# Patient Record
Sex: Male | Born: 1970 | Race: White | Hispanic: No | Marital: Married | Smoking: Never smoker
Health system: Southern US, Community
[De-identification: ages and names within clinical notes are randomized; demographics above are authoritative.]

## PROBLEM LIST (undated history)

## (undated) HISTORY — PX: VASECTOMY: SHX75

---

## 2015-04-24 ENCOUNTER — Emergency Department (HOSPITAL_COMMUNITY)
Admission: EM | Admit: 2015-04-24 | Discharge: 2015-04-25 | Disposition: A | Discharge status: Home / Self Care | Payer: BLUE CROSS/BLUE SHIELD | Attending: Emergency Medicine | Admitting: Emergency Medicine

## 2015-04-24 ENCOUNTER — Emergency Department (HOSPITAL_COMMUNITY): Payer: BLUE CROSS/BLUE SHIELD

## 2015-04-24 ENCOUNTER — Encounter (HOSPITAL_COMMUNITY): Payer: Self-pay | Admitting: Nurse Practitioner

## 2015-04-24 DIAGNOSIS — N200 Calculus of kidney: Secondary | ICD-10-CM | POA: Diagnosis not present

## 2015-04-24 DIAGNOSIS — K4090 Unilateral inguinal hernia, without obstruction or gangrene, not specified as recurrent: Secondary | ICD-10-CM | POA: Insufficient documentation

## 2015-04-24 DIAGNOSIS — R109 Unspecified abdominal pain: Secondary | ICD-10-CM | POA: Diagnosis present

## 2015-04-24 DIAGNOSIS — N50811 Right testicular pain: Secondary | ICD-10-CM

## 2015-04-24 LAB — URINE MICROSCOPIC-ADD ON
BACTERIA UA: NONE SEEN
RBC / HPF: NONE SEEN RBC/hpf (ref 0–5)
SQUAMOUS EPITHELIAL / LPF: NONE SEEN
WBC, UA: NONE SEEN WBC/hpf (ref 0–5)

## 2015-04-24 LAB — CBC
HCT: 42 % (ref 39.0–52.0)
HEMOGLOBIN: 14.6 g/dL (ref 13.0–17.0)
MCH: 32.5 pg (ref 26.0–34.0)
MCHC: 34.8 g/dL (ref 30.0–36.0)
MCV: 93.5 fL (ref 78.0–100.0)
PLATELETS: 269 10*3/uL (ref 150–400)
RBC: 4.49 MIL/uL (ref 4.22–5.81)
RDW: 12.6 % (ref 11.5–15.5)
WBC: 9.9 10*3/uL (ref 4.0–10.5)

## 2015-04-24 LAB — BASIC METABOLIC PANEL
ANION GAP: 11 (ref 5–15)
BUN: 15 mg/dL (ref 6–20)
CALCIUM: 9.5 mg/dL (ref 8.9–10.3)
CHLORIDE: 105 mmol/L (ref 101–111)
CO2: 25 mmol/L (ref 22–32)
Creatinine, Ser: 1.16 mg/dL (ref 0.61–1.24)
GFR calc non Af Amer: >60 mL/min (ref >60)
Glucose, Bld: 118 mg/dL — ABNORMAL HIGH (ref 65–99)
Potassium: 3.9 mmol/L (ref 3.5–5.1)
SODIUM: 141 mmol/L (ref 135–145)

## 2015-04-24 LAB — URINALYSIS, ROUTINE W REFLEX MICROSCOPIC
Bilirubin Urine: NEGATIVE
Glucose, UA: NEGATIVE mg/dL
Hgb urine dipstick: NEGATIVE
Ketones, ur: NEGATIVE mg/dL
LEUKOCYTES UA: NEGATIVE
NITRITE: NEGATIVE
PH: 8 (ref 5.0–8.0)
Protein, ur: NEGATIVE mg/dL
SPECIFIC GRAVITY, URINE: 1.019 (ref 1.005–1.030)

## 2015-04-24 MED ORDER — TAMSULOSIN HCL 0.4 MG PO CAPS: 0.4000 mg | ORAL_CAPSULE | Freq: Every day | ORAL | Status: AC

## 2015-04-24 MED ORDER — HYDROMORPHONE HCL 1 MG/ML IJ SOLN
1.0000 mg | Freq: Once | INTRAMUSCULAR | Status: AC
  Administered 2015-04-24: 1 mg via INTRAVENOUS
  Filled 2015-04-24: qty 1

## 2015-04-24 MED ORDER — HYDROCODONE-ACETAMINOPHEN 5-325 MG PO TABS: 1.0000 | ORAL_TABLET | ORAL | Status: AC | PRN

## 2015-04-24 MED ORDER — ONDANSETRON HCL 4 MG/2ML IJ SOLN
4.0000 mg | Freq: Once | INTRAMUSCULAR | Status: AC
  Administered 2015-04-24: 4 mg via INTRAVENOUS
  Filled 2015-04-24: qty 2

## 2015-04-24 MED ORDER — KETOROLAC TROMETHAMINE 15 MG/ML IJ SOLN
15.0000 mg | Freq: Once | INTRAMUSCULAR | Status: AC
Start: 2015-04-24 — End: 2015-04-24
  Administered 2015-04-24: 15 mg via INTRAVENOUS
  Filled 2015-04-24: qty 1

## 2015-04-24 NOTE — ED Notes (Signed)
Pt to CT

## 2015-04-24 NOTE — ED Notes (Signed)
Pt left with all his belongings and ambulated out of the treatment area.  

## 2015-04-24 NOTE — ED Notes (Addendum)
Pt reports sudden onset R flank pain radiating into R groin and testicle this afternoon while sitting on toilet to have BM. States pain is severe and hes never had any pain like this. He vomited once due to pain. Pt is diaphoretic, can not sit still and appears very uncomfortable. Denies urinary changes.

## 2015-04-24 NOTE — Discharge Instructions (Signed)
Dietary Guidelines to Help Prevent Kidney Stones °Your risk of kidney stones can be decreased by adjusting the foods you eat. The most important thing you can do is drink enough fluid. You should drink enough fluid to keep your urine clear or pale yellow. The following guidelines provide specific information for the type of kidney stone you have had. °GUIDELINES ACCORDING TO TYPE OF KIDNEY STONE °Calcium Oxalate Kidney Stones °· Reduce the amount of salt you eat. Foods that have a lot of salt cause your body to release excess calcium into your urine. The excess calcium can combine with a substance called oxalate to form kidney stones. °· Reduce the amount of animal protein you eat if the amount you eat is excessive. Animal protein causes your body to release excess calcium into your urine. Ask your dietitian how much protein from animal sources you should be eating. °· Avoid foods that are high in oxalates. If you take vitamins, they should have less than 500 mg of vitamin C. Your body turns vitamin C into oxalates. You do not need to avoid fruits and vegetables high in vitamin C. °Calcium Phosphate Kidney Stones °· Reduce the amount of salt you eat to help prevent the release of excess calcium into your urine. °· Reduce the amount of animal protein you eat if the amount you eat is excessive. Animal protein causes your body to release excess calcium into your urine. Ask your dietitian how much protein from animal sources you should be eating. °· Get enough calcium from food or take a calcium supplement (ask your dietitian for recommendations). Food sources of calcium that do not increase your risk of kidney stones include: °¨ Broccoli. °¨ Dairy products, such as cheese and yogurt. °¨ Pudding. °Uric Acid Kidney Stones °· Do not have more than 6 oz of animal protein per day. °FOOD SOURCES °Animal Protein Sources °· Meat (all types). °· Poultry. °· Eggs. °· Fish, seafood. °Foods High in Salt °· Salt seasonings. °· Soy  sauce. °· Teriyaki sauce. °· Cured and processed meats. °· Salted crackers and snack foods. °· Fast food. °· Canned soups and most canned foods. °Foods High in Oxalates °· Grains: °¨ Amaranth. °¨ Barley. °¨ Grits. °¨ Wheat germ. °¨ Bran. °¨ Buckwheat flour. °¨ All bran cereals. °¨ Pretzels. °¨ Whole wheat bread. °· Vegetables: °¨ Beans (wax). °¨ Beets and beet greens. °¨ Collard greens. °¨ Eggplant. °¨ Escarole. °¨ Leeks. °¨ Okra. °¨ Parsley. °¨ Rutabagas. °¨ Spinach. °¨ Swiss chard. °¨ Tomato paste. °¨ Fried potatoes. °¨ Sweet potatoes. °· Fruits: °¨ Red currants. °¨ Figs. °¨ Kiwi. °¨ Rhubarb. °· Meat and Other Protein Sources: °¨ Beans (dried). °¨ Soy burgers and other soybean products. °¨ Miso. °¨ Nuts (peanuts, almonds, pecans, cashews, hazelnuts). °¨ Nut butters. °¨ Sesame seeds and tahini (paste made of sesame seeds). °¨ Poppy seeds. °· Beverages: °¨ Chocolate drink mixes. °¨ Soy milk. °¨ Instant iced tea. °¨ Juices made from high-oxalate fruits or vegetables. °· Other: °¨ Carob. °¨ Chocolate. °¨ Fruitcake. °¨ Marmalades. °  °This information is not intended to replace advice given to you by your health care provider. Make sure you discuss any questions you have with your health care provider. °  °Document Released: 07/01/2010 Document Revised: 03/11/2013 Document Reviewed: 01/31/2013 °Elsevier Interactive Patient Education ©2016 [employer] ° ° °Kidney Stones °Kidney stones (urolithiasis) are deposits that form inside your kidneys. The intense pain is caused by the stone moving through the urinary tract. When the stone moves, the   ureter goes into spasm around the stone. The stone is usually passed in the urine.  °CAUSES  °· A disorder that makes certain neck glands produce too much parathyroid hormone (primary hyperparathyroidism). °· A buildup of uric acid crystals, similar to gout in your joints. °· Narrowing (stricture) of the ureter. °· A kidney obstruction present at birth (congenital  obstruction). °· Previous surgery on the kidney or ureters. °· Numerous kidney infections. °SYMPTOMS  °· Feeling sick to your stomach (nauseous). °· Throwing up (vomiting). °· Blood in the urine (hematuria). °· Pain that usually spreads (radiates) to the groin. °· Frequency or urgency of urination. °DIAGNOSIS  °· Taking a history and physical exam. °· Blood or urine tests. °· CT scan. °· Occasionally, an examination of the inside of the urinary bladder (cystoscopy) is performed. °TREATMENT  °· Observation. °· Increasing your fluid intake. °· Extracorporeal shock wave lithotripsy--This is a noninvasive procedure that uses shock waves to break up kidney stones. °· Surgery may be needed if you have severe pain or persistent obstruction. There are various surgical procedures. Most of the procedures are performed with the use of small instruments. Only small incisions are needed to accommodate these instruments, so recovery time is minimized. °The size, location, and chemical composition are all important variables that will determine the proper choice of action for you. Talk to your health care provider to better understand your situation so that you will minimize the risk of injury to yourself and your kidney.  °HOME CARE INSTRUCTIONS  °· Drink enough water and fluids to keep your urine clear or pale yellow. This will help you to pass the stone or stone fragments. °· Strain all urine through the provided strainer. Keep all particulate matter and stones for your health care provider to see. The stone causing the pain may be as small as a grain of salt. It is very important to use the strainer each and every time you pass your urine. The collection of your stone will allow your health care provider to analyze it and verify that a stone has actually passed. The stone analysis will often identify what you can do to reduce the incidence of recurrences. °· Only take over-the-counter or prescription medicines for pain,  discomfort, or fever as directed by your health care provider. °· Keep all follow-up visits as told by your health care provider. This is important. °· Get follow-up X-rays if required. The absence of pain does not always mean that the stone has passed. It may have only stopped moving. If the urine remains completely obstructed, it can cause loss of kidney function or even complete destruction of the kidney. It is your responsibility to make sure X-rays and follow-ups are completed. Ultrasounds of the kidney can show blockages and the status of the kidney. Ultrasounds are not associated with any radiation and can be performed easily in a matter of minutes. °· Make changes to your daily diet as told by your health care provider. You may be told to: °¨ Limit the amount of salt that you eat. °¨ Eat 5 or more servings of fruits and vegetables each day. °¨ Limit the amount of meat, poultry, fish, and eggs that you eat. °· Collect a 24-hour urine sample as told by your health care provider. You may need to collect another urine sample every 6-12 months. °SEEK MEDICAL CARE IF: °· You experience pain that is progressive and unresponsive to any pain medicine you have been prescribed. °SEEK IMMEDIATE MEDICAL CARE IF:  °·   Pain cannot be controlled with the prescribed medicine. °· You have a fever or shaking chills. °· The severity or intensity of pain increases over 18 hours and is not relieved by pain medicine. °· You develop a new onset of abdominal pain. °· You feel faint or pass out. °· You are unable to urinate. °  °This information is not intended to replace advice given to you by your health care provider. Make sure you discuss any questions you have with your health care provider. °  °Document Released: 03/06/2005 Document Revised: 11/25/2014 Document Reviewed: 08/07/2012 °Elsevier Interactive Patient Education ©2016 Elsevier Inc. ° °

## 2015-04-24 NOTE — ED Provider Notes (Addendum)
CSN: [id number]     Arrival date & time 04/24/15  1752 History   First MD Initiated Contact with Patient 04/24/15 1844     Chief Complaint  Patient presents with  . Testicle Pain  . Flank Pain     (Consider location/radiation/quality/duration/timing/severity/associated sxs/prior Treatment) Patient is a 45 y.o. male presenting with testicular pain and flank pain. The history is provided by the patient.  Testicle Pain This is a new problem. Episode onset: 3 hours. The problem occurs constantly. The problem has not changed since onset.Associated symptoms comments: rlq pain, right testicle pain severe and sudden on onset.  Pt denies dysuria or hematuria.  No prior hx of kidney stones.  Started when having a BM today.  No n/v/d. Nothing aggravates the symptoms. Nothing relieves the symptoms. He has tried nothing for the symptoms. The treatment provided no relief.  Flank Pain    History reviewed. No pertinent past medical history. Past Surgical History  Procedure Laterality Date  . Vasectomy     History reviewed. No pertinent family history. Social History  Substance Use Topics  . Smoking status: Never Smoker   . Smokeless tobacco: None  . Alcohol Use: Yes    Review of Systems  Genitourinary: Positive for flank pain and testicular pain.  All other systems reviewed and are negative.     Allergies  Review of patient's allergies indicates no known allergies.  Home Medications   Prior to Admission medications   Not on File   BP 177/96 mmHg  Pulse 56  Temp(Src) 98.1 F (36.7 C) (Oral)  Resp 19  Ht  (1.803 m)  Wt 202 lb 9.6 oz (91.899 kg)  BMI 28.27 kg/m2  SpO2 100% Physical Exam  Constitutional: He is oriented to person, place, and time. He appears well-developed and well-nourished. No distress.  HENT:  Head: Normocephalic and atraumatic.  Mouth/Throat: Oropharynx is clear and moist.  Eyes: Conjunctivae and EOM are normal. Pupils are equal, round, and reactive to  light.  Neck: Normal range of motion. Neck supple.  Cardiovascular: Normal rate, regular rhythm and intact distal pulses.   No murmur heard. Pulmonary/Chest: Effort normal and breath sounds normal. No respiratory distress. He has no wheezes. He has no rales.  Abdominal: Soft. He exhibits no distension. There is tenderness. There is guarding. There is no rebound. A hernia is present. Hernia confirmed positive in the right inguinal area.  Genitourinary: Penis normal. Right testis shows tenderness. Left testis shows no mass and no tenderness.  Bulge and swelling in the right inguinal area however unable to palpate the right testicle with pain with palpation  Musculoskeletal: Normal range of motion. He exhibits no edema or tenderness.  Neurological: He is alert and oriented to person, place, and time.  Skin: Skin is warm and dry. No rash noted. No erythema.  Psychiatric: He has a normal mood and affect. His behavior is normal.  Nursing note and vitals reviewed.   ED Course  Procedures (including critical care time) Labs Review Labs Reviewed  URINALYSIS, ROUTINE W REFLEX MICROSCOPIC (NOT AT [facility]) - Abnormal; Notable for the following:    APPearance TURBID (*)    All other components within normal limits  BASIC METABOLIC PANEL - Abnormal; Notable for the following:    Glucose, Bld 118 (*)    All other components within normal limits  CBC  URINE MICROSCOPIC-ADD ON    Imaging Review Ct Abdomen Pelvis Wo Contrast  04/24/2015  CLINICAL DATA:  Right inguinal pain EXAM:  CT ABDOMEN AND PELVIS WITHOUT CONTRAST TECHNIQUE: Multidetector CT imaging of the abdomen and pelvis was performed following the standard protocol without IV contrast. COMPARISON:  Scrotal ultrasound today FINDINGS: Lower chest:  Lung bases clear without infiltrate or effusion Hepatobiliary: Normal liver in size and contour. No liver lesion. Gallbladder and bile ducts normal. Pancreas: Negative Spleen: Negative Adrenals/Urinary  Tract: Hydronephrosis and hydroureter on the right. There is right perinephric edema due to obstruction. There is a 3 x 5 mm stone right UVJ causing obstruction of the right ureter. No other renal calculi bilaterally. No left renal obstruction. Urinary bladder otherwise normal. Stomach/Bowel: Negative for bowel obstruction. Sigmoid diverticulosis on the left without diverticulitis. Normal appendix. Vascular/Lymphatic: Negative for aortic aneurysm. Minimal aortic atherosclerotic calcification. No lymphadenopathy. Reproductive: Normal prostate size. Clips in the scrotum bilaterally. Other: No free fluid Musculoskeletal: Scoliosis.  No acute skeletal abnormality. IMPRESSION: 3 x 5 mm stone at the right UVJ causing obstruction of the right kidney. No other renal calculi. Electronically Signed   By: Marlan Palau M.D.   On: 04/24/2015 21:58   US Scrotum  04/24/2015  CLINICAL DATA:  Severe right testicle pain for 4 hours EXAM: SCROTAL ULTRASOUND DOPPLER ULTRASOUND OF THE TESTICLES TECHNIQUE: Complete ultrasound examination of the testicles, epididymis, and other scrotal structures was performed. Color and spectral Doppler ultrasound were also utilized to evaluate blood flow to the testicles. COMPARISON:  None. FINDINGS: Right testicle Measurements: 42 x 21 x 34 mm. No mass identified. The testicle is located in the right inguinal region. Left testicle Measurements: 36 x 21 x 29 mm. No mass or microlithiasis visualized. Right epididymis:  Normal in size and appearance. Left epididymis: Mildly prominent when compared to the contralateral side but with no evidence of hyperemia Hydrocele:  No significant hydroceles Varicocele:  None visualized. Pulsed Doppler interrogation of both testes demonstrates normal low resistance arterial and venous waveforms bilaterally. The study is limited by patient motion. IMPRESSION: Abnormal location of the right testicle within the right inguinal canal. Currently no evidence of torsion.  Electronically Signed   By: Esperanza Heir M.D.   On: 04/24/2015 20:24   [place] Art/ven Flow Abd Pelv Doppler  04/24/2015  CLINICAL DATA:  Severe right testicle pain for 4 hours EXAM: SCROTAL ULTRASOUND DOPPLER ULTRASOUND OF THE TESTICLES TECHNIQUE: Complete ultrasound examination of the testicles, epididymis, and other scrotal structures was performed. Color and spectral Doppler ultrasound were also utilized to evaluate blood flow to the testicles. COMPARISON:  None. FINDINGS: Right testicle Measurements: 42 x 21 x 34 mm. No mass identified. The testicle is located in the right inguinal region. Left testicle Measurements: 36 x 21 x 29 mm. No mass or microlithiasis visualized. Right epididymis:  Normal in size and appearance. Left epididymis: Mildly prominent when compared to the contralateral side but with no evidence of hyperemia Hydrocele:  No significant hydroceles Varicocele:  None visualized. Pulsed Doppler interrogation of both testes demonstrates normal low resistance arterial and venous waveforms bilaterally. The study is limited by patient motion. IMPRESSION: Abnormal location of the right testicle within the right inguinal canal. Currently no evidence of torsion. Electronically Signed   By: Esperanza Heir M.D.   On: 04/24/2015 20:24   I have personally reviewed and evaluated these images and lab results as part of my medical decision-making.   EKG Interpretation None      MDM   Final diagnoses:  Pain in right testicle  Kidney stone on right side    Patient is a 45 year old healthy  male with no medical problems presenting today with sudden onset of severe right groin pain that started approximately 3 hours ago. The pain is not improving and he cannot do anything to make it better. He denies any nausea, vomiting or urinary symptoms. He complains of a lot of pain in the right groin area but denies any testicular pain. On exam patient has a bulging firm mass in his right inguinal  area. Concern that patient may have an incarcerated right inguinal hernia vs torsed testicle as difficult to palpate his testicle. Will get stat U/S to further evaluate for testicular torsion  8:42 PM Patient's labs are within normal limits. Ultrasound to rule out torsion shows abnormal placement of the right testicle in the right inguinal canal.  Pt still having pain but u/s showed no signs of torsion. Will get scan to ensure no other pathology.  10:16 PM CT showed a 3x51mm stone in the right UVJ with hydronephrosis.  This is most likely the cuase of the pt's pain.  Will give toradol and ensure pain control and have f/u with urology  Gwyneth Sprout, MD 04/24/15 1610  Gwyneth Sprout, MD 04/24/15 2317

## 2015-04-27 ENCOUNTER — Encounter (HOSPITAL_COMMUNITY): Payer: Self-pay | Admitting: Anesthesiology

## 2015-04-27 ENCOUNTER — Ambulatory Visit (HOSPITAL_COMMUNITY): Admission: AD | Admit: 2015-04-27 | Payer: BLUE CROSS/BLUE SHIELD | Source: Ambulatory Visit | Admitting: Urology

## 2015-04-27 ENCOUNTER — Other Ambulatory Visit: Payer: Self-pay | Admitting: Urology

## 2015-04-27 ENCOUNTER — Encounter (HOSPITAL_COMMUNITY): Admission: AD | Payer: Self-pay | Source: Ambulatory Visit

## 2015-04-27 SURGERY — CYSTOSCOPY, WITH CALCULUS REMOVAL USING BASKET
Anesthesia: General | Laterality: Right

## 2015-04-27 NOTE — H&P (Signed)
Reason For Visit Kidney stone   Active Problems Problems  1. Right ureteral stone (N20.1)  History of Present Illness    1st stone event for Reginald Young, a 45 yo male from [place], referred by Dr. Anitra Lauth [facility] physician) for further evaluation of a kidney stone.     He was seen in the [facility] on Saturday 04/24/15 for acute onset Rt testicular pain & RLQ abdominal pain, and Right flank pain beginning Saturday afternoon at 4pm. No gross hematuria. + nausea and vomiting. He has been unable to eat since Saturday.     CT showed a 3x5 mm Rt UVJ stone with hydronephrosis. Scrotal u/s showed abnormal location of Rt testicle within the Rt inguinal canal. No evidence of torsion. Pt was sent home with tamsulosin, and urine strainer , and pain medicine, and told to f/u with Urology, and that he would probably pass stone over weekend.    However, he has continued to have pain, nausea and vomiting throughout weekend, and today. He has not eaten today at all. ( Tried to eat orange at 06:30 but could not. Last drank water at 08:00).    Family hx: negative  Sodas and fast foods: FF 2-3x/week. Sodas: 2-3/day. ( Dt. Mt Dew).   Past Medical History Problems  1. History of No significant past medical history  Surgical History Problems  1. History of Surgery Vas Deferens Vasectomy  Current Meds 1. Lortab TABS;  Therapy: (Recorded:07Feb2017) to Recorded 2. Tamsulosin HCl - 0.4 MG Oral Capsule;  Therapy: (Recorded:07Feb2017) to Recorded  Allergies Medication  1. No Known Drug Allergies  Family History Problems  1. No pertinent family history  Social History Problems    Alcohol use (Z78.9)   Caffeine use (F15.90)   Former smoker [phone])   Former smoker (Z87.891)   Married   Number of children   Occupation  Review of Systems  Genitourinary: hematuria and testicular pain.  Gastrointestinal: nausea, vomiting and abdominal pain.  Constitutional: fever, night sweats, feeling tired  (fatigue) and recent weight loss.    Vitals Vital Signs [Data Includes: Last 1 Day]  Recorded: 07Feb2017 10:18AM  Height: 5 ft 11 in Weight: 197 lb  BMI Calculated: 27.48 BSA Calculated: 2.1 Blood Pressure: 153 / 93 Heart Rate: 64  Physical Exam Constitutional: Well nourished and well developed . No acute distress.  ENT:. The ears and nose are normal in appearance.  Neck: The appearance of the neck is normal and no neck mass is present.  Pulmonary: No respiratory distress and normal respiratory rhythm and effort.  Cardiovascular: Heart rate and rhythm are normal . No peripheral edema.  Abdomen: The abdomen is soft and nontender. No masses are palpated. No CVA tenderness. No hernias are palpable.  A right inguinal hernia is present. No hepatosplenomegaly noted.  Genitourinary: Examination of the penis demonstrates no discharge, no masses, no lesions and a normal meatus. The scrotum is without lesions. The right epididymis is palpably normal and non-tender. The left epididymis is palpably normal and non-tender. The right testis is non-tender and without masses. The left testis is non-tender and without masses.  Lymphatics: The femoral and inguinal nodes are not enlarged or tender.  Skin: Normal skin turgor, no visible rash and no visible skin lesions.  Neuro/Psych:. Mood and affect are appropriate.    Results/Data  Urine [Data Includes: Last 1 Day]   07Feb2017  COLOR AMBER   APPEARANCE CLEAR   SPECIFIC GRAVITY >1.030   pH 6.0   GLUCOSE NEGATIVE  BILIRUBIN NEGATIVE   KETONE 1+   BLOOD 1+   PROTEIN 1+   NITRITE NEGATIVE   LEUKOCYTE ESTERASE NEGATIVE   SQUAMOUS EPITHELIAL/HPF 0-5 HPF  WBC NONE SEEN WBC/HPF  RBC 0-2 RBC/HPF  BACTERIA NONE SEEN HPF  CRYSTALS NONE SEEN HPF  CASTS NONE SEEN LPF  Yeast NONE SEEN HPF   27 Apr 2015 10:03 AM  UA With REFLEX    COLOR AMBER     APPEARANCE CLEAR     SPECIFIC GRAVITY >1.030     pH 6.0     GLUCOSE NEGATIVE     BILIRUBIN NEGATIVE      KETONE 1+     BLOOD 1+     PROTEIN 1+     NITRITE NEGATIVE     LEUKOCYTE ESTERASE NEGATIVE     WBC NONE SEEN     RBC 0-2     SQUAMOUS EPITHELIAL/HPF 0-5     BACTERIA NONE SEEN     CRYSTALS NONE SEEN     CASTS NONE SEEN     Yeast NONE SEEN   Assessment Assessed  1. Right ureteral stone (N20.1)  45 yo male with 3 x 5mm RL ureter UV junction stone. We have reviewed his CT and KUB. He will have cysto and Right retrograde pyelogram and stone extraction tonight.   Plan  Right ureteral stone  1. Administered: Ketorolac Tromethamine 60 MG/2ML Injection Solution  KUB; Status:Resulted - Requires Verification;  Done: 01Jan0001 12:00AM Due:09Feb2017; Marked Important;Ordered; Today;  ZOX:WRUEA ureteral stone; Ordered VW:[id number], Vonya Ohalloran;   Quarry manager signed by : Jethro Bolus, M.D.; Apr 27 2015 12:39PM EST

## 2017-01-25 IMAGING — CT CT ABD-PELV W/O CM
2 of 4 series · 16 of 46 positions shown, 18 images · non-contrast
Comparison: Scrotal ultrasound today

CLINICAL DATA: Right inguinal pain

EXAM:
CT ABDOMEN AND PELVIS WITHOUT CONTRAST
TECHNIQUE: Multidetector CT imaging of the abdomen and pelvis was performed
following the standard protocol without IV contrast.

[Series 2: renal stone 5mm · axial · 0.74mm/px · z∈[-473,-23]mm · 13 of 100 slices shown, 15 images]
[im 5/100  soft-tissue]
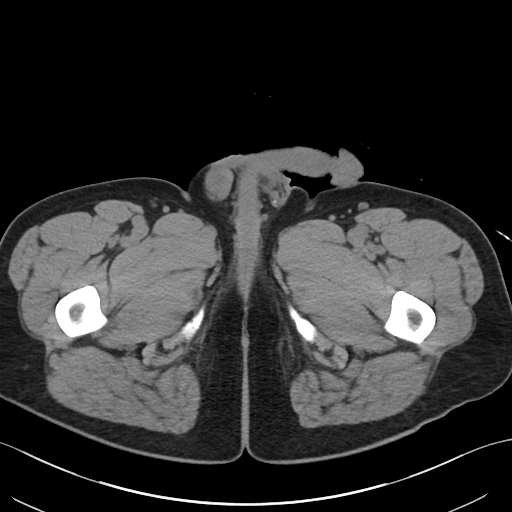
[im 5/100  bone]
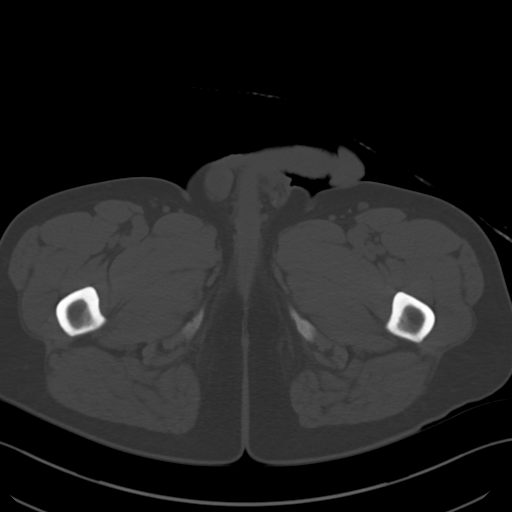
[im 13/100  soft-tissue]
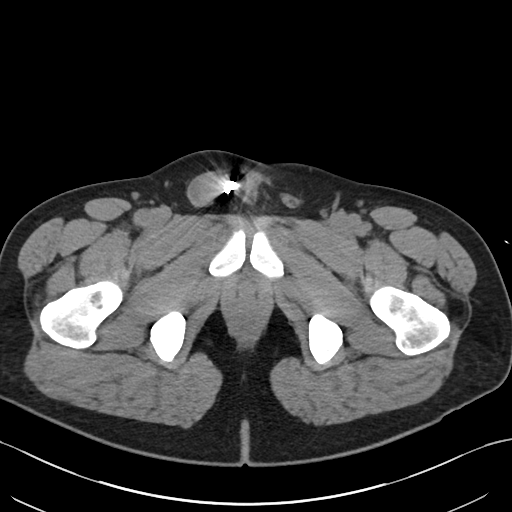
[im 21/100  soft-tissue]
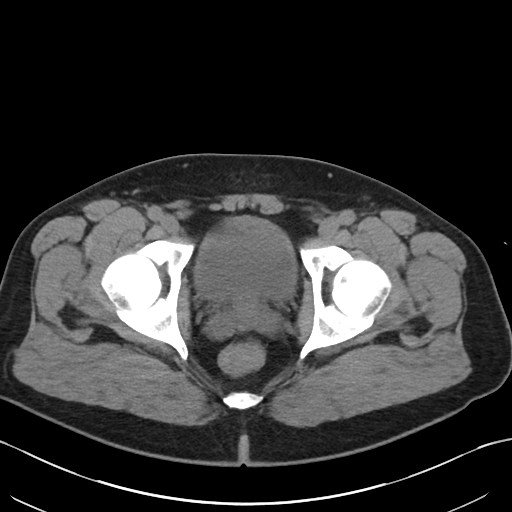
[im 29/100  soft-tissue]
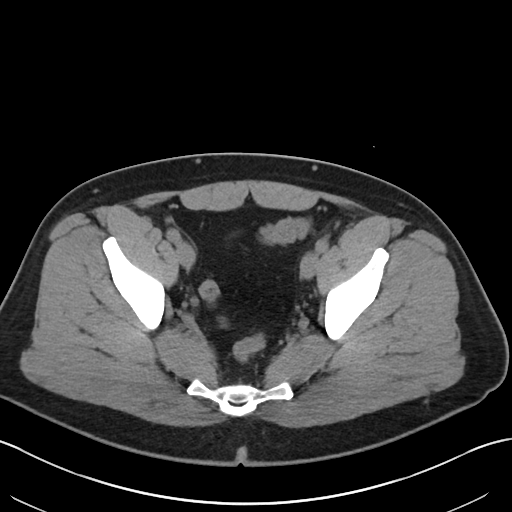
[im 34/100  soft-tissue]
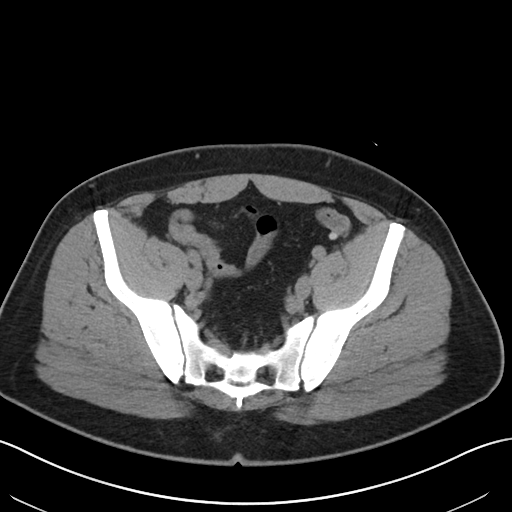
[im 42/100  soft-tissue]
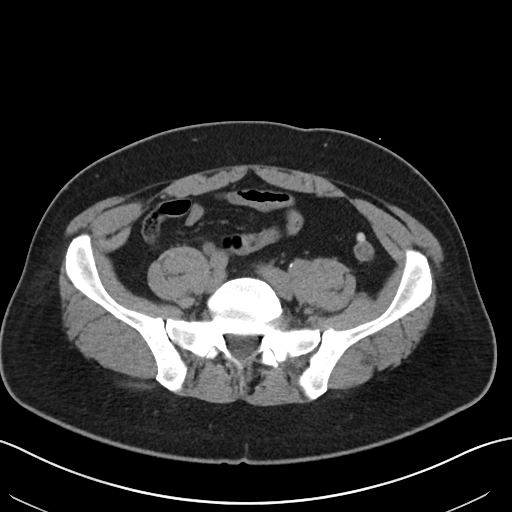
[im 50/100  soft-tissue]
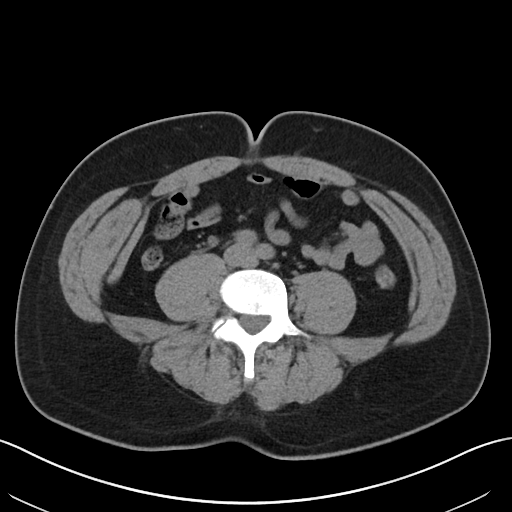
[im 58/100  soft-tissue]
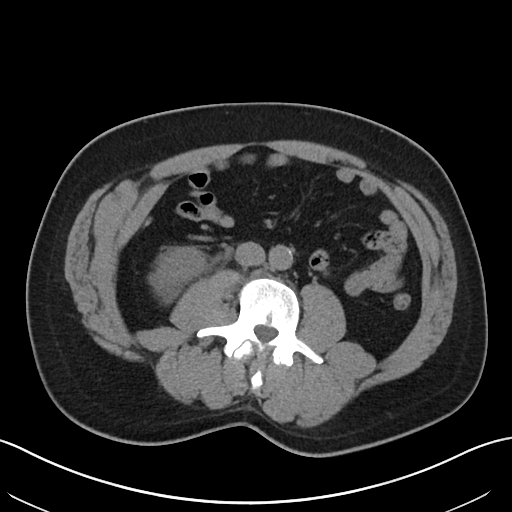
[im 67/100  soft-tissue]
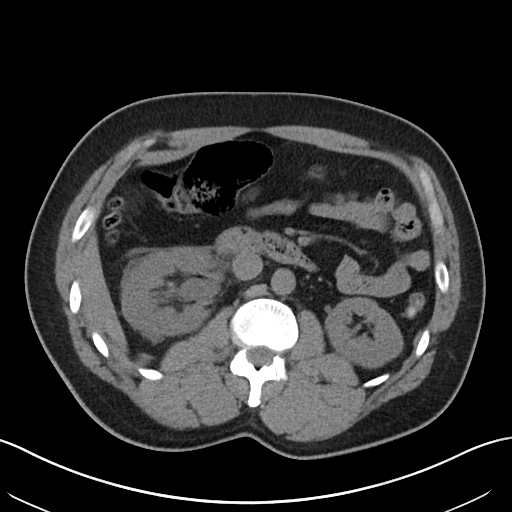
[im 67/100  bone]
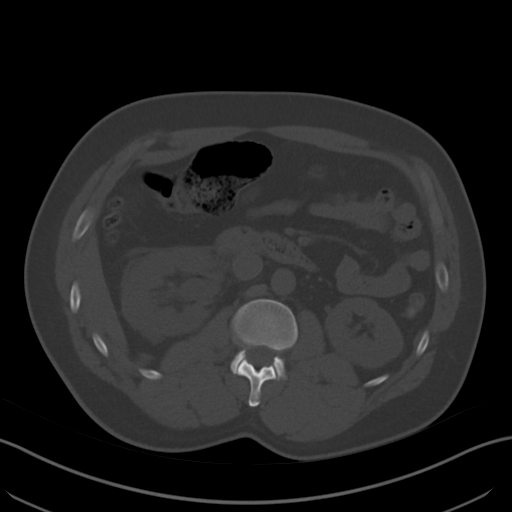
[im 71/100  soft-tissue]
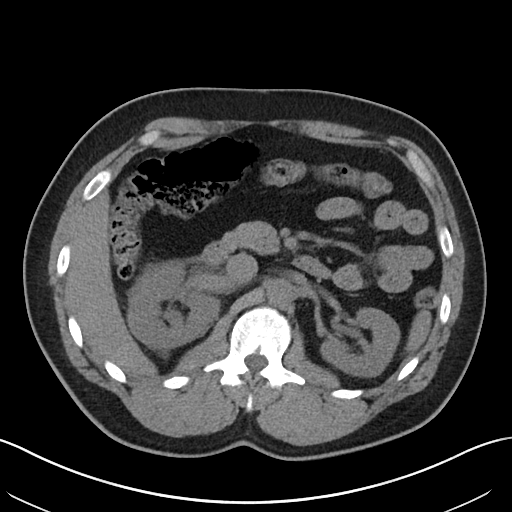
[im 79/100  soft-tissue]
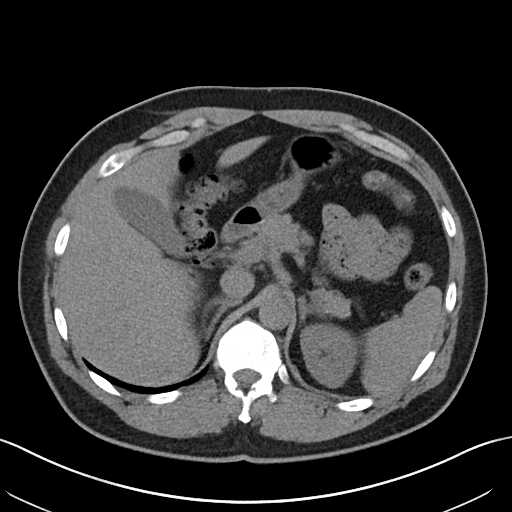
[im 87/100  soft-tissue]
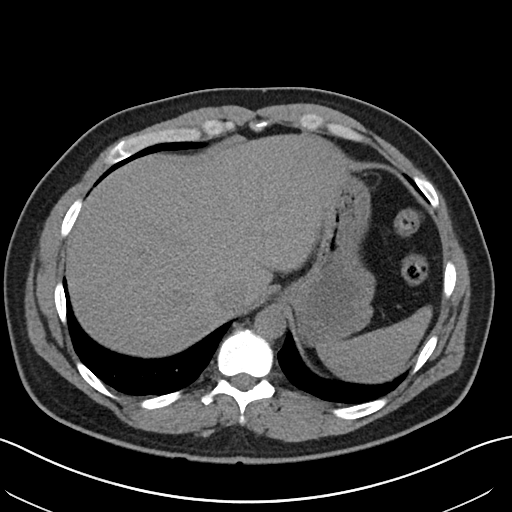
[im 95/100  soft-tissue]
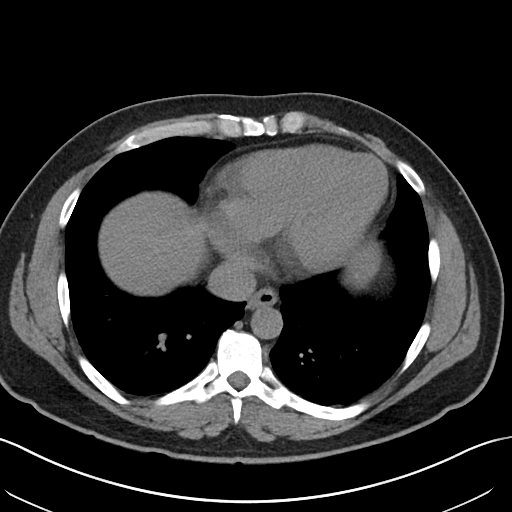

[Series 5: renal stone 3.0 cor · coronal · 0.80mm/px · 3 of 79 slices shown]
[im 27/79  soft-tissue]
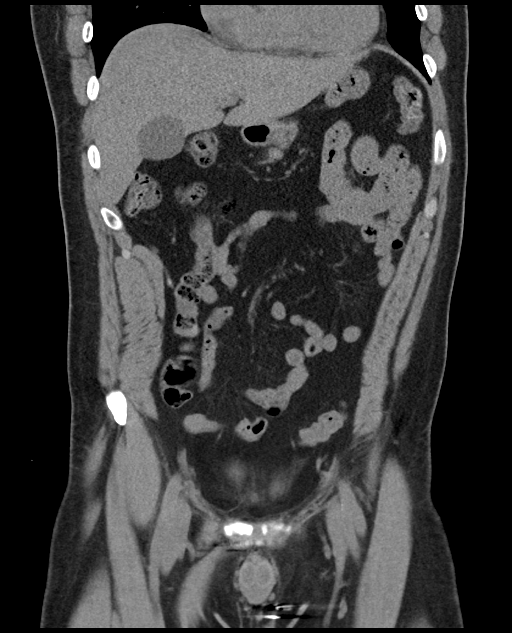
[im 35/79  soft-tissue]
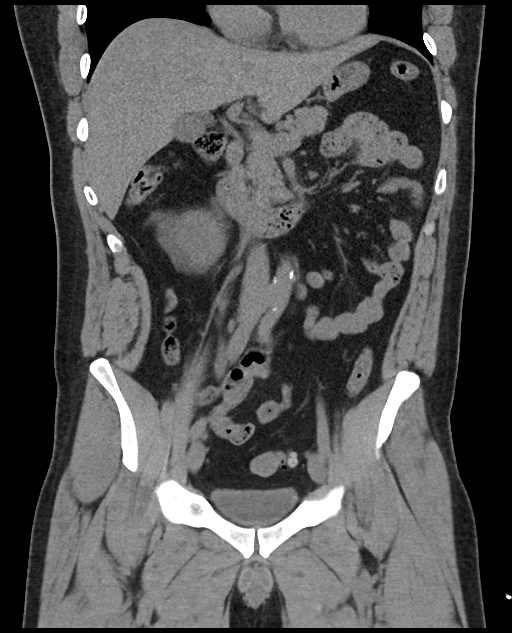
[im 44/79  soft-tissue]
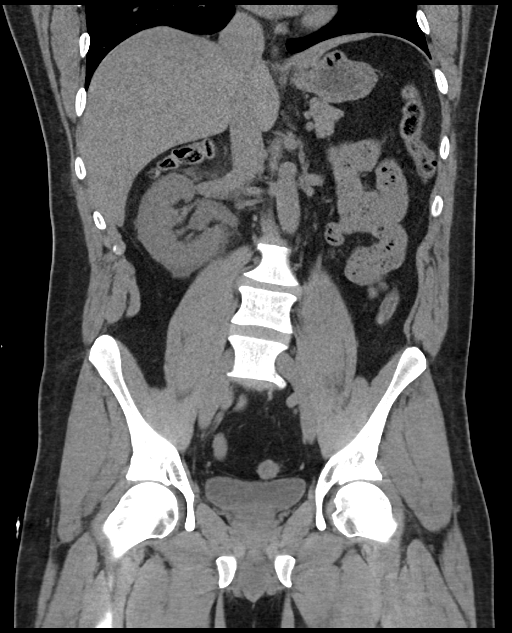

[16 of 46 positions shown; findings below may reference images not displayed]

FINDINGS: Lower chest:  Lung bases clear without infiltrate or effusion

Hepatobiliary: Normal liver in size and contour. No liver lesion.
Gallbladder and bile ducts normal.

Pancreas: Negative

Spleen: Negative

Adrenals/Urinary Tract: Hydronephrosis and hydroureter on the right.
There is right perinephric edema due to obstruction. There is a 3 x
5 mm stone right UVJ causing obstruction of the right ureter. No
other renal calculi bilaterally. No left renal obstruction. Urinary
bladder otherwise normal.

Stomach/Bowel: Negative for bowel obstruction. Sigmoid
diverticulosis on the left without diverticulitis. Normal appendix.

Vascular/Lymphatic: Negative for aortic aneurysm. Minimal aortic
atherosclerotic calcification. No lymphadenopathy.

Reproductive: Normal prostate size. Clips in the scrotum
bilaterally.

Other: No free fluid

Musculoskeletal: Scoliosis.  No acute skeletal abnormality.
IMPRESSION: 3 x 5 mm stone at the right UVJ causing obstruction of the right
kidney. No other renal calculi.
